# Patient Record
Sex: Male | Born: 2000 | Race: White | Hispanic: No | Marital: Single | Smoking: Never smoker
Health system: Southern US, Community
[De-identification: ages and names within clinical notes are randomized; demographics above are authoritative.]

## PROBLEM LIST (undated history)

## (undated) DIAGNOSIS — F909 Attention-deficit hyperactivity disorder, unspecified type: Secondary | ICD-10-CM

## (undated) DIAGNOSIS — J40 Bronchitis, not specified as acute or chronic: Secondary | ICD-10-CM

---
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
## 2019-08-04 ENCOUNTER — Other Ambulatory Visit: Payer: Self-pay

## 2019-08-04 DIAGNOSIS — Z20822 Contact with and (suspected) exposure to covid-19: Secondary | ICD-10-CM

## 2019-08-06 LAB — NOVEL CORONAVIRUS, NAA: SARS-CoV-2, NAA: NOT DETECTED

## 2019-08-11 ENCOUNTER — Ambulatory Visit: Payer: Self-pay

## 2019-08-11 ENCOUNTER — Other Ambulatory Visit: Payer: Self-pay

## 2019-08-11 ENCOUNTER — Other Ambulatory Visit: Payer: Self-pay | Admitting: Family Medicine

## 2019-08-11 DIAGNOSIS — M79641 Pain in right hand: Secondary | ICD-10-CM

## 2021-04-25 IMAGING — DX DG HAND COMPLETE 3+V*R*
3 series · 3 of 3 positions shown · non-contrast
Comparison: None.

CLINICAL DATA: Right hand pain, crushing injury to [DATE],
and fourth digit

EXAM:
RIGHT HAND - COMPLETE 3+ VIEW

[hand pa]
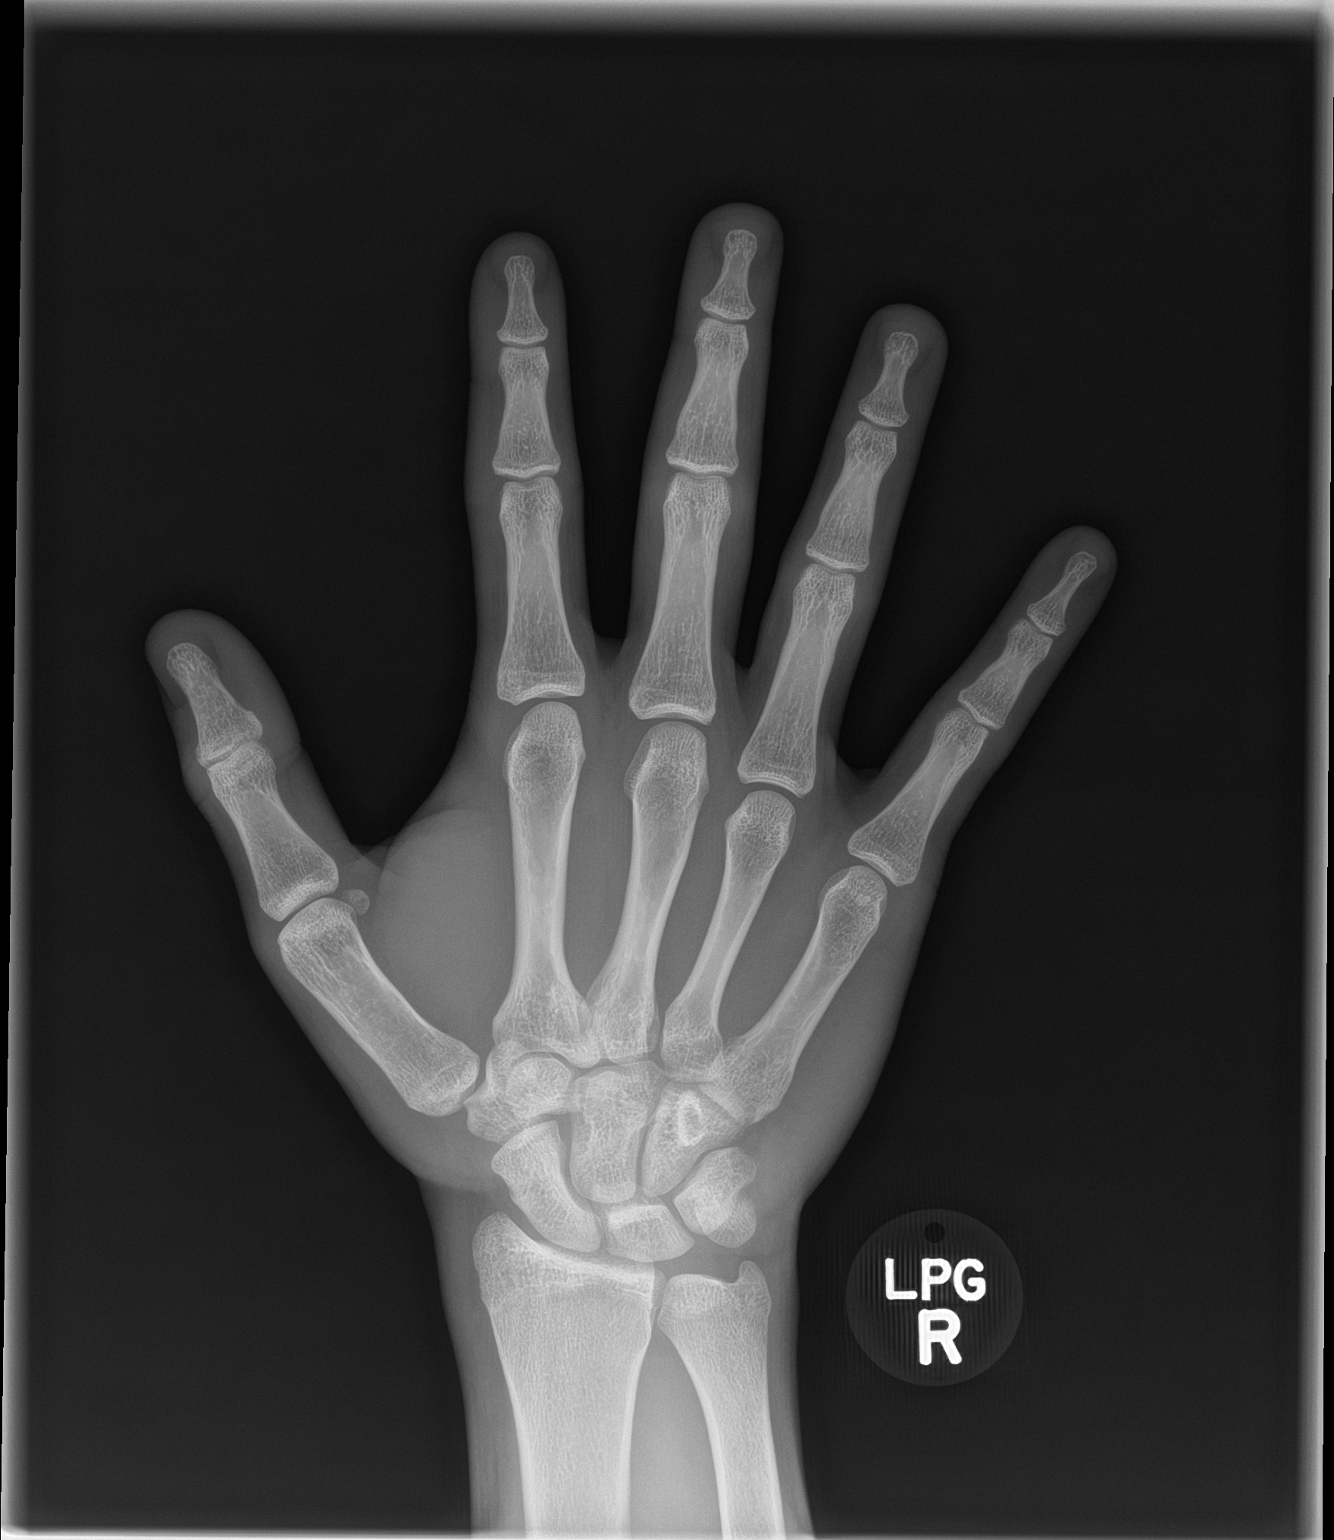

[hand obl]
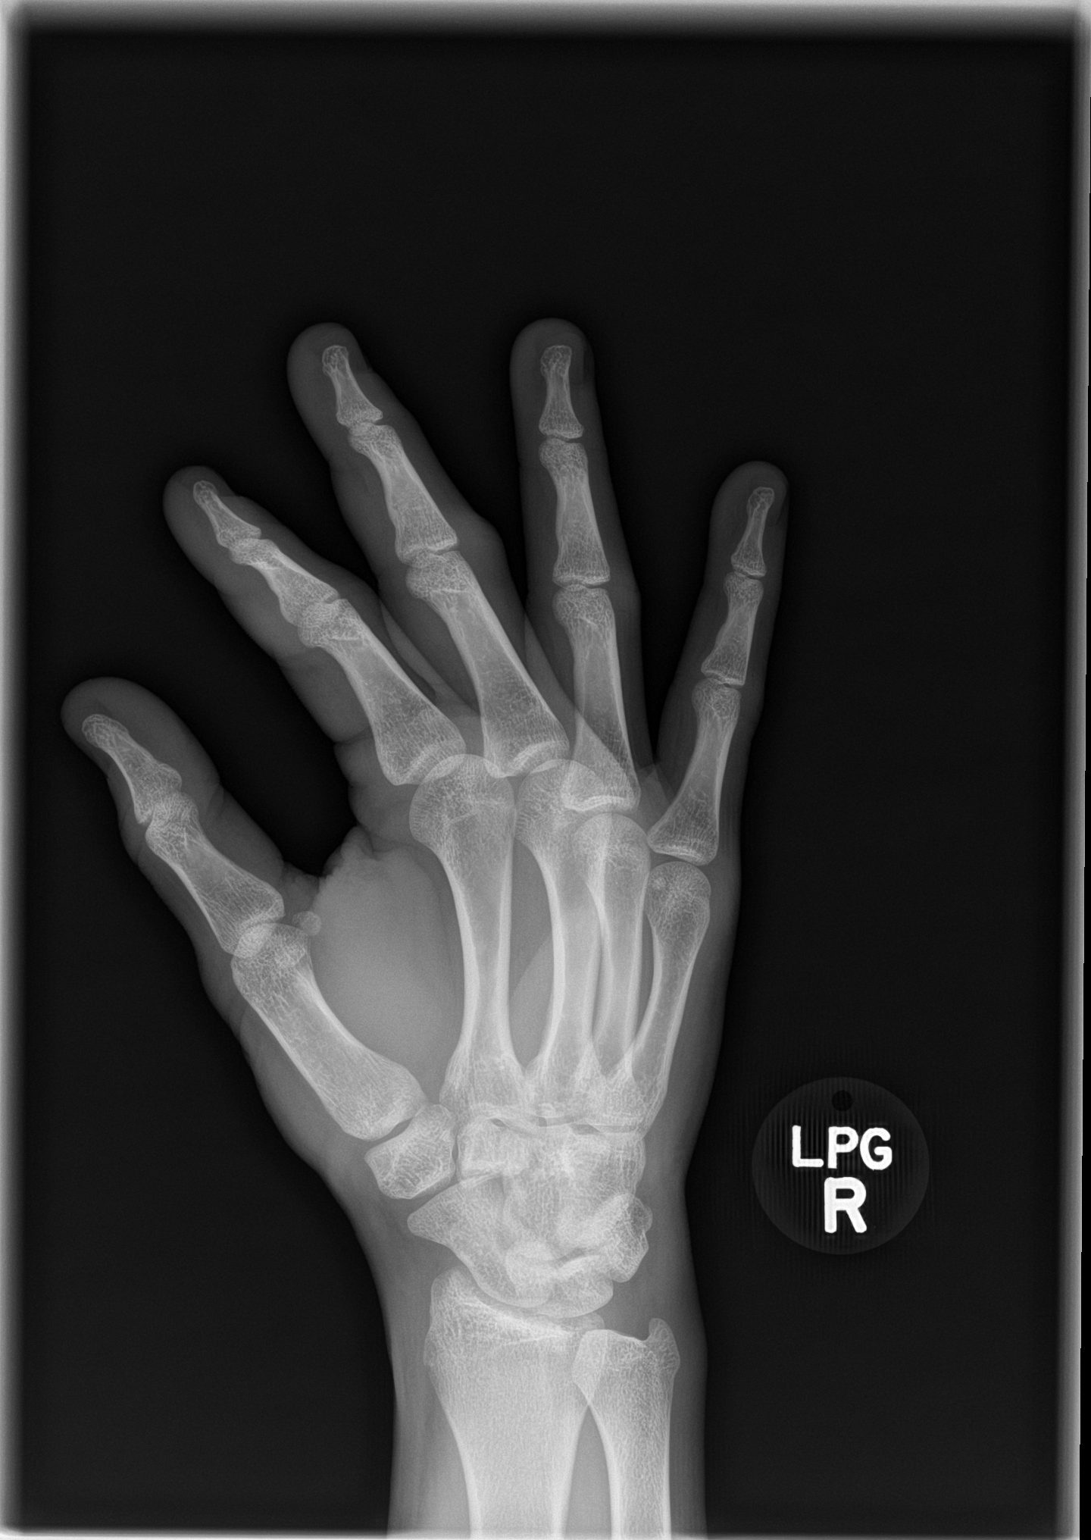

[hand lat]
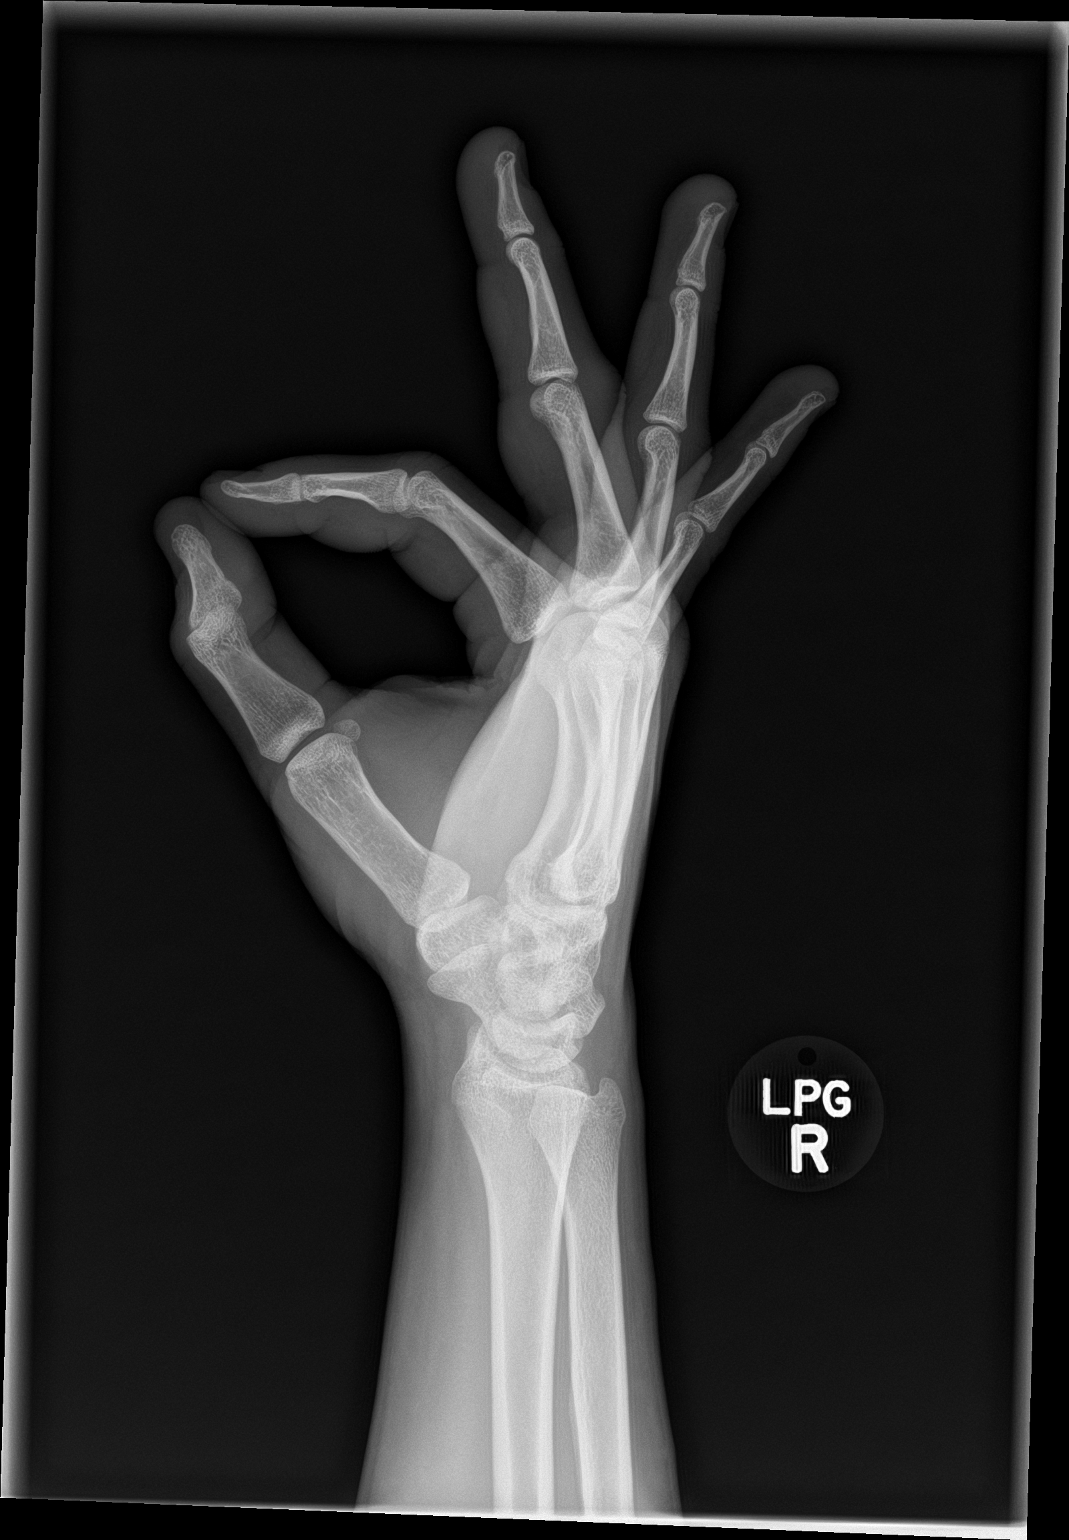

[3 of 3 positions shown; findings below may reference images not displayed]

FINDINGS: No fracture or dislocation of the right hand. Joint spaces are well
preserved. Soft tissue edema about the proximal interphalangeal
joint of the third digit.
IMPRESSION: No fracture or dislocation of the right hand. Soft tissue edema
about the proximal interphalangeal joint of the third digit.
